# Patient Record
Sex: Female | Born: 1955 | Race: White | Hispanic: No
Health system: Southern US, Community
[De-identification: ages and names within clinical notes are randomized; demographics above are authoritative.]

---

## 2004-09-12 ENCOUNTER — Encounter: Admission: RE | Admit: 2004-09-12 | Discharge: 2004-09-12 | Payer: Self-pay | Admitting: Internal Medicine

## 2004-09-15 ENCOUNTER — Encounter: Admission: RE | Admit: 2004-09-15 | Discharge: 2004-09-15 | Payer: Self-pay | Admitting: Internal Medicine

## 2004-10-01 ENCOUNTER — Other Ambulatory Visit: Admission: RE | Admit: 2004-10-01 | Discharge: 2004-10-01 | Payer: Self-pay | Admitting: Internal Medicine

## 2004-11-21 ENCOUNTER — Ambulatory Visit (HOSPITAL_COMMUNITY): Admission: RE | Admit: 2004-11-21 | Discharge: 2004-11-21 | Payer: Self-pay | Admitting: Obstetrics and Gynecology

## 2004-11-21 ENCOUNTER — Encounter (INDEPENDENT_AMBULATORY_CARE_PROVIDER_SITE_OTHER): Payer: Self-pay | Admitting: Specialist

## 2005-10-08 ENCOUNTER — Other Ambulatory Visit: Admission: RE | Admit: 2005-10-08 | Discharge: 2005-10-08 | Payer: Self-pay | Admitting: Obstetrics and Gynecology

## 2006-01-21 ENCOUNTER — Encounter: Admission: RE | Admit: 2006-01-21 | Discharge: 2006-01-21 | Payer: Self-pay | Admitting: Internal Medicine

## 2006-02-12 ENCOUNTER — Encounter: Admission: RE | Admit: 2006-02-12 | Discharge: 2006-02-12 | Payer: Self-pay | Admitting: Internal Medicine

## 2006-02-17 ENCOUNTER — Encounter: Admission: RE | Admit: 2006-02-17 | Discharge: 2006-02-17 | Payer: Self-pay | Admitting: Internal Medicine

## 2009-02-20 ENCOUNTER — Other Ambulatory Visit: Admission: RE | Admit: 2009-02-20 | Discharge: 2009-02-20 | Payer: Self-pay | Admitting: Obstetrics and Gynecology

## 2010-12-03 ENCOUNTER — Encounter
Admission: RE | Admit: 2010-12-03 | Discharge: 2010-12-03 | Payer: Self-pay | Source: Home / Self Care | Attending: Orthopedic Surgery | Admitting: Orthopedic Surgery

## 2011-02-22 ENCOUNTER — Other Ambulatory Visit: Payer: Self-pay | Admitting: Family Medicine

## 2011-02-22 ENCOUNTER — Other Ambulatory Visit (HOSPITAL_COMMUNITY)
Admission: RE | Admit: 2011-02-22 | Discharge: 2011-02-22 | Disposition: A | Discharge status: Home / Self Care | Payer: 59 | Source: Ambulatory Visit | Attending: Family Medicine | Admitting: Family Medicine

## 2011-02-22 DIAGNOSIS — Z01419 Encounter for gynecological examination (general) (routine) without abnormal findings: Secondary | ICD-10-CM | POA: Insufficient documentation

## 2011-03-22 NOTE — H&P (Signed)
NAME:  Natasha Lewis, Natasha Lewis NO.:  [id number]   MEDICAL RECORD NO.:  [id number]           PATIENT TYPE:   LOCATION:                                 FACILITY:   PHYSICIAN:  Artist Pais, M.D.         DATE OF BIRTH:   DATE OF ADMISSION:  11/21/2004  DATE OF DISCHARGE:                                HISTORY & PHYSICAL   HISTORY OF PRESENT ILLNESS:  The patient is a 55 year old gravida 1, para 0,  SAB, Caucasian female originally referred by Dr. Crista Luria for  endometrial polyp on October 12, 2004.  The patient complained at that time  of irregular bleeding but no post coital bleeding.  She was found to have a  fleshy polypoid structure at the internal os.  Due to her history of  irregular bleeding, she was worked up for this and subsequently was found to  have a very thick endometrium measuring 1.5 cm.  Sonohysterogram showed that  the endometrial cavity had thickening of both the anterior and posterior  walls with mixed echo defects.  This was so thick that the decision was made  to undergo a D&C and hysteroscopy.  There was a focal mixed echo defect  which could be a polyp measuring 3.1 x 1.6 cm without vascular flow.  The  anterior endometrium measured 6.3 mm and the posterior wall measured 15.6  mm.  The decision was made to do a D&C hysteroscopy due to this very thick  endometrium and possible endometrial polyp and also at that time to remove  this possible cervical polyp.  However, this polyp is approximately 1.5 cm  and the patient understands that it may be necessary for me to remove this  by ligating the stalk and removing it using leak cautery.  I have explained  that this could bleed significantly such that she might require an emergent  total abdominal hysterectomy and she expresses understanding of and  acceptance of the risks of surgery including anesthetic complication,  hemorrhage, infection, damage to adjacent structures including bladder,  bowel,  blood vessels, and ureter were discussed with the patient.  She was  made aware of the risks of uterine perforation which could result in  lacerating hemorrhage requiring emergent hysterectomy or uterine perforation  which could result in bowel damage resulting in colostomy or overwhelming  peritonitis.  She expresses understanding of and acceptance of these risks  and desires to proceed.  She does know that it will be challenging to first  remove the polyp and then attempt to dilate her cervix and hysteroscope the  endometrium without causing further bleeding but it also is difficult to  traverse the canal with the polyp in place and this could very well be a  fibroid.  She is thus admitted for [facility] hysteroscopy and removal of this  cervical polyp or cervical fibroid.  All questions were answered.   PAST MEDICAL HISTORY:  Perimenopause, history of upper respiratory  infections.   ALLERGIES:  No known drug allergies.   CURRENT MEDICATIONS:  Jasmine, multivitamin, calcium.  The patient was  started on jasmine by Dr. Marny Lowenstein when she complained of irregular bleeding.   PAST SURGICAL HISTORY:  D&C after a miscarriage in 39.   FAMILY HISTORY:  There is no family history of colon, breast, or ovarian  cancer.  Her father is 14 with prostate cancer and hypertension.  Mother is  34 with hyperthyroidism, hypertension, and pernicious anemia.  Two siblings,  ages 23 and 67, alive and well.   SOCIAL HISTORY:  Occupation:  Building control surveyor.  Tobacco:  None.  Alcohol:  Occasional.   REVIEW OF SYSTEMS:  Noncontributory except as noted above.  Denies headache,  visual changes, chest pain, shortness of breath, abdominal pain, change in  bowel habits, unintentional weight loss, dysuria, urgency, frequency,  vaginal pruritus or discharge, pain or bleeding with intercourse.   PHYSICAL EXAMINATION:  GENERAL APPEARANCE:  A well-developed Caucasian  female.  VITAL SIGNS:  Blood pressure 134/94,  heart rate 88, weight 184.  We will  also recheck her blood pressure prior to surgery as she is on oral  contraceptives.  HEENT:  Normal.  NECK:  Supple without thyromegaly, adenopathy, or nodules.  CHEST:  Clear to auscultation.  BREASTS:  Symmetrical without masses, nodes, nipple retraction, or nipple  discharge.  CARDIAC:  Regular rate and rhythm without extra sounds or murmurs.  ABDOMEN:  Soft, nontender, no hepatosplenomegaly or masses.  EXTREMITIES:  No clubbing, cyanosis, or edema.  NEUROLOGIC:  Oriented x3.  Grossly normal.  PELVIC:  Normal external female genitalia.  No vulvar, vaginal, or cervical  lesions.  Pap smear performed by Dr. Marny Lowenstein recently is within normal  limits.  The patient is noted to have either a cervical polyp or prolapsed  fibroid.  Bimanual examination reveals uterus to be about 6 weeks, mobile,  and nontender without any adnexal mass palpated.   ASSESSMENT AND PLAN:  The patient is a 55 year old gravida 1, para 0,  Caucasian female originally referred for menometrorrhagia and a cervical  polyp admitted for cervical polyp or fibroid removal who was also found to  have a very thickened endometrium and possible endometrial polyp admitted  for [facility] hysteroscopy to rule out malignancy.  All risks have been discussed  with the patient.  She is also made aware of unforeseen risks.  She  expresses understanding of and acceptance of these risks and desires to  proceed with surgery.       ___________________________________________  Artist Pais, M.D.    DC/MEDQ  D:  11/20/2004  T:  11/20/2004  Job:  [id number]   cc:   Preoperative area

## 2011-03-22 NOTE — Op Note (Signed)
NAME:  Natasha Lewis, Natasha Lewis NO.:  [id number]   MEDICAL RECORD NO.:  [id number]          PATIENT TYPE:  AMB   LOCATION:  SDC                           FACILITY:  [facility]   PHYSICIAN:  Artist Pais, M.D.    DATE OF BIRTH:  05-03-1956   DATE OF PROCEDURE:  11/21/2004  DATE OF DISCHARGE:                                 OPERATIVE REPORT   PREOPERATIVE DIAGNOSIS:  Endometrial polyp, menometrorrhagia, large cervical  polyp versus prolapsed fibroid.   POSTOPERATIVE DIAGNOSIS:  Endometrial polyp, menometrorrhagia, large  endocervical polyp.   OPERATION PERFORMED:  Hysteroscopy, dilation and curettage, cervical polyp  removal.   SURGEON:  Artist Pais, M.D.   ANESTHESIA:  Monitored anesthesia care plus 20 mL 1% lidocaine, paracervical  block.   SPECIMENS:  Endocervical polyp, endometrial curettings and polyps.   ESTIMATED BLOOD LOSS:  Minimal.   FLUIDS REPLACED:  Approximately 1100 mL crystalloid.   DESCRIPTION OF PROCEDURE:  The patient was brought to the operating room and  was identified on the operating room table.  After induction of adequate MAC  analgesia, the patient was placed in the dorsal lithotomy position and  prepped and draped in the usual sterile fashion.  The bladder was straight  cathed for clear yellow urine.  Examination under anesthesia revealed the  uterus to be anteverted, about 6 weeks, mobile and nontender, without any  adnexal mass palpated.  The speculum was placed and the anterior lip of the  cervix was infiltrated with 1 mL of 1% lidocaine and grasped with a single  toothed tenaculum.  The remaining 19 mL were placed for a paracervical  block.  Looking at this large fleshy mass emanating from the endocervical  canal, I decided that it would be difficult to maneuver the instrument past  this area, so I decided to go ahead and remove it.  I did grasp the mass  with ring forceps and was able to twist and remove this.  When I grasped it,  I  could easily ascertain that this was an endocervical polyp and not a  fibroid.  There was noted to be no bleeding subsequently.  The cervix was  dilated up to a #25 Pratt dilator.  Dilatation proceeded carefully and  gently to decrease the risk of uterine perforation.  The uterus sounded to  approximately 6.5 cm.  Subsequently, using Sorbitol as a distending medium,  the [facility] hysteroscope was placed and a careful and thorough hysteroscopic  examination was performed. The endometrium was noted to be extremely thick  and the posterior wall appeared to be polypoid. However, I do think this is  just thick endometrium.  This could be possible endometrial polyps as per  the appearance on hysteroscopy and on sonohysterogram.  Because of such  thick lining, I was unable to visualize the tubal ostia.  After a careful  and thorough hysteroscopic examination was performed, the scope was removed  and a careful curettage was performed.  The uterus was curetted in a  systematic clockwise fashion with tissue obtained.  It was curetted until a  good cry  was heard all around and I placed the Randall stone forceps to  remove additional tissue.  This did appear to be endometrial tissue as  opposed to polyps.  Subsequently, the hysteroscope was again placed and the  entire endometrium was noted to have been well curetted and sampled.  Any  other polypoid areas appeared to have been removed.  At that point, the  procedure was then terminated. The patient had been given 20 mL of 1%  lidocaine as a paracervical block, also for postoperative comfort as well as  comfort intraoperatively.  She was subsequently transferred to the recovery  room in stable condition after all instrument, sponge and needle counts were  correct.  It should be noted that there was noted to be no bleeding from the  cervical os, no bleeding from the endocervical polyp removal site and there  was noted to be no bleeding from the tenaculum  site.  The patient received a  postoperative D&C instruction sheet and urged to call with any problems.  She will return to the office in two to three weeks for a follow-up  examination and to discuss treatment plan.      DC/MEDQ  D:  11/21/2004  T:  11/21/2004  Job:  [id number]   cc:   Sharlet Salina, M.D.  [address]  [place]  [place] [address]  Fax: [phone]

## 2013-07-26 ENCOUNTER — Other Ambulatory Visit: Payer: Self-pay | Admitting: Gastroenterology

## 2019-02-22 DIAGNOSIS — E119 Type 2 diabetes mellitus without complications: Secondary | ICD-10-CM | POA: Diagnosis not present

## 2019-02-22 DIAGNOSIS — E785 Hyperlipidemia, unspecified: Secondary | ICD-10-CM | POA: Diagnosis not present

## 2019-02-22 DIAGNOSIS — M5030 Other cervical disc degeneration, unspecified cervical region: Secondary | ICD-10-CM | POA: Diagnosis not present

## 2019-02-22 DIAGNOSIS — I10 Essential (primary) hypertension: Secondary | ICD-10-CM | POA: Diagnosis not present

## 2019-07-26 ENCOUNTER — Other Ambulatory Visit: Payer: Self-pay | Admitting: Family Medicine

## 2019-07-26 DIAGNOSIS — Z1231 Encounter for screening mammogram for malignant neoplasm of breast: Secondary | ICD-10-CM

## 2019-09-09 ENCOUNTER — Ambulatory Visit
Admission: RE | Admit: 2019-09-09 | Discharge: 2019-09-09 | Disposition: A | Discharge status: Home / Self Care | Payer: Self-pay | Source: Ambulatory Visit | Attending: Family Medicine | Admitting: Family Medicine

## 2019-09-09 ENCOUNTER — Other Ambulatory Visit: Payer: Self-pay

## 2019-09-09 DIAGNOSIS — Z1231 Encounter for screening mammogram for malignant neoplasm of breast: Secondary | ICD-10-CM | POA: Diagnosis not present

## 2020-03-07 DIAGNOSIS — E785 Hyperlipidemia, unspecified: Secondary | ICD-10-CM | POA: Diagnosis not present

## 2020-03-07 DIAGNOSIS — Z0000 Encounter for general adult medical examination without abnormal findings: Secondary | ICD-10-CM | POA: Diagnosis not present

## 2020-03-07 DIAGNOSIS — I10 Essential (primary) hypertension: Secondary | ICD-10-CM | POA: Diagnosis not present

## 2020-03-07 DIAGNOSIS — E1169 Type 2 diabetes mellitus with other specified complication: Secondary | ICD-10-CM | POA: Diagnosis not present

## 2020-03-07 DIAGNOSIS — M5030 Other cervical disc degeneration, unspecified cervical region: Secondary | ICD-10-CM | POA: Diagnosis not present

## 2020-03-13 DIAGNOSIS — R52 Pain, unspecified: Secondary | ICD-10-CM | POA: Diagnosis not present

## 2020-03-13 DIAGNOSIS — M79672 Pain in left foot: Secondary | ICD-10-CM | POA: Diagnosis not present

## 2020-08-12 DIAGNOSIS — Z20822 Contact with and (suspected) exposure to covid-19: Secondary | ICD-10-CM | POA: Diagnosis not present

## 2020-09-05 DIAGNOSIS — R69 Illness, unspecified: Secondary | ICD-10-CM | POA: Diagnosis not present

## 2020-09-06 DIAGNOSIS — I10 Essential (primary) hypertension: Secondary | ICD-10-CM | POA: Diagnosis not present

## 2020-09-06 DIAGNOSIS — E1169 Type 2 diabetes mellitus with other specified complication: Secondary | ICD-10-CM | POA: Diagnosis not present

## 2020-09-06 DIAGNOSIS — M5030 Other cervical disc degeneration, unspecified cervical region: Secondary | ICD-10-CM | POA: Diagnosis not present

## 2020-09-06 DIAGNOSIS — Z1389 Encounter for screening for other disorder: Secondary | ICD-10-CM | POA: Diagnosis not present

## 2020-09-14 ENCOUNTER — Ambulatory Visit: Payer: Self-pay | Attending: Internal Medicine

## 2020-09-14 DIAGNOSIS — Z23 Encounter for immunization: Secondary | ICD-10-CM

## 2020-09-14 NOTE — Progress Notes (Signed)
   Covid-19 Vaccination Clinic  Name:  LANDIS CASSARO    MRN: [id number] DOB: [birth year]  09/14/2020  Ms. Kristensen was observed post Covid-19 immunization for 15 minutes without incident. She was provided with Vaccine Information Sheet and instruction to access the V-Safe system.   Ms. Applin was instructed to call 911 with any severe reactions post vaccine: Marland Kitchen Difficulty breathing  . Swelling of face and throat  . A fast heartbeat  . A bad rash all over body  . Dizziness and weakness

## 2020-09-20 DIAGNOSIS — Z20822 Contact with and (suspected) exposure to covid-19: Secondary | ICD-10-CM | POA: Diagnosis not present

## 2020-10-24 ENCOUNTER — Other Ambulatory Visit: Payer: Self-pay | Admitting: Family Medicine

## 2020-10-24 DIAGNOSIS — Z1231 Encounter for screening mammogram for malignant neoplasm of breast: Secondary | ICD-10-CM

## 2020-12-04 ENCOUNTER — Other Ambulatory Visit: Payer: Self-pay

## 2020-12-04 ENCOUNTER — Ambulatory Visit
Admission: RE | Admit: 2020-12-04 | Discharge: 2020-12-04 | Disposition: A | Discharge status: Home / Self Care | Payer: Medicare HMO | Source: Ambulatory Visit | Attending: Family Medicine | Admitting: Family Medicine

## 2020-12-04 DIAGNOSIS — Z1231 Encounter for screening mammogram for malignant neoplasm of breast: Secondary | ICD-10-CM | POA: Diagnosis not present

## 2021-03-16 DIAGNOSIS — I10 Essential (primary) hypertension: Secondary | ICD-10-CM | POA: Diagnosis not present

## 2021-03-16 DIAGNOSIS — Z5320 Procedure and treatment not carried out because of patient's decision for unspecified reasons: Secondary | ICD-10-CM | POA: Diagnosis not present

## 2021-03-16 DIAGNOSIS — Z6835 Body mass index (BMI) 35.0-35.9, adult: Secondary | ICD-10-CM | POA: Diagnosis not present

## 2021-03-16 DIAGNOSIS — Z0000 Encounter for general adult medical examination without abnormal findings: Secondary | ICD-10-CM | POA: Diagnosis not present

## 2021-03-16 DIAGNOSIS — M5030 Other cervical disc degeneration, unspecified cervical region: Secondary | ICD-10-CM | POA: Diagnosis not present

## 2021-03-16 DIAGNOSIS — E1169 Type 2 diabetes mellitus with other specified complication: Secondary | ICD-10-CM | POA: Diagnosis not present

## 2021-03-16 DIAGNOSIS — E785 Hyperlipidemia, unspecified: Secondary | ICD-10-CM | POA: Diagnosis not present

## 2021-03-16 DIAGNOSIS — Z1389 Encounter for screening for other disorder: Secondary | ICD-10-CM | POA: Diagnosis not present

## 2021-03-16 DIAGNOSIS — Z1159 Encounter for screening for other viral diseases: Secondary | ICD-10-CM | POA: Diagnosis not present

## 2021-03-16 DIAGNOSIS — Z23 Encounter for immunization: Secondary | ICD-10-CM | POA: Diagnosis not present

## 2021-03-20 ENCOUNTER — Other Ambulatory Visit: Payer: Self-pay | Admitting: Family Medicine

## 2021-03-20 DIAGNOSIS — E2839 Other primary ovarian failure: Secondary | ICD-10-CM

## 2021-04-12 DIAGNOSIS — H524 Presbyopia: Secondary | ICD-10-CM | POA: Diagnosis not present

## 2021-06-08 ENCOUNTER — Ambulatory Visit
Admission: RE | Admit: 2021-06-08 | Discharge: 2021-06-08 | Disposition: A | Discharge status: Home / Self Care | Payer: Medicare HMO | Source: Ambulatory Visit | Attending: Family Medicine | Admitting: Family Medicine

## 2021-06-08 ENCOUNTER — Other Ambulatory Visit: Payer: Self-pay

## 2021-06-08 DIAGNOSIS — E2839 Other primary ovarian failure: Secondary | ICD-10-CM

## 2021-06-08 DIAGNOSIS — Z780 Asymptomatic menopausal state: Secondary | ICD-10-CM | POA: Diagnosis not present

## 2021-09-18 DIAGNOSIS — E785 Hyperlipidemia, unspecified: Secondary | ICD-10-CM | POA: Diagnosis not present

## 2021-09-18 DIAGNOSIS — Z23 Encounter for immunization: Secondary | ICD-10-CM | POA: Diagnosis not present

## 2021-09-18 DIAGNOSIS — M5030 Other cervical disc degeneration, unspecified cervical region: Secondary | ICD-10-CM | POA: Diagnosis not present

## 2021-09-18 DIAGNOSIS — I10 Essential (primary) hypertension: Secondary | ICD-10-CM | POA: Diagnosis not present

## 2021-09-18 DIAGNOSIS — Z1331 Encounter for screening for depression: Secondary | ICD-10-CM | POA: Diagnosis not present

## 2021-09-18 DIAGNOSIS — Z1389 Encounter for screening for other disorder: Secondary | ICD-10-CM | POA: Diagnosis not present

## 2021-09-18 DIAGNOSIS — E1169 Type 2 diabetes mellitus with other specified complication: Secondary | ICD-10-CM | POA: Diagnosis not present

## 2021-10-25 ENCOUNTER — Other Ambulatory Visit: Payer: Self-pay | Admitting: Family Medicine

## 2021-10-25 DIAGNOSIS — Z1231 Encounter for screening mammogram for malignant neoplasm of breast: Secondary | ICD-10-CM

## 2021-12-05 ENCOUNTER — Ambulatory Visit
Admission: RE | Admit: 2021-12-05 | Discharge: 2021-12-05 | Disposition: A | Discharge status: Home / Self Care | Payer: Medicare HMO | Source: Ambulatory Visit | Attending: Family Medicine | Admitting: Family Medicine

## 2021-12-05 DIAGNOSIS — Z1231 Encounter for screening mammogram for malignant neoplasm of breast: Secondary | ICD-10-CM

## 2022-04-08 DIAGNOSIS — Z1389 Encounter for screening for other disorder: Secondary | ICD-10-CM | POA: Diagnosis not present

## 2022-04-08 DIAGNOSIS — M5030 Other cervical disc degeneration, unspecified cervical region: Secondary | ICD-10-CM | POA: Diagnosis not present

## 2022-04-08 DIAGNOSIS — Z5320 Procedure and treatment not carried out because of patient's decision for unspecified reasons: Secondary | ICD-10-CM | POA: Diagnosis not present

## 2022-04-08 DIAGNOSIS — I10 Essential (primary) hypertension: Secondary | ICD-10-CM | POA: Diagnosis not present

## 2022-04-08 DIAGNOSIS — E1169 Type 2 diabetes mellitus with other specified complication: Secondary | ICD-10-CM | POA: Diagnosis not present

## 2022-04-08 DIAGNOSIS — E785 Hyperlipidemia, unspecified: Secondary | ICD-10-CM | POA: Diagnosis not present

## 2022-04-08 DIAGNOSIS — Z0000 Encounter for general adult medical examination without abnormal findings: Secondary | ICD-10-CM | POA: Diagnosis not present

## 2022-05-25 DIAGNOSIS — J069 Acute upper respiratory infection, unspecified: Secondary | ICD-10-CM | POA: Diagnosis not present

## 2022-05-25 DIAGNOSIS — H6992 Unspecified Eustachian tube disorder, left ear: Secondary | ICD-10-CM | POA: Diagnosis not present

## 2022-10-08 DIAGNOSIS — Z5320 Procedure and treatment not carried out because of patient's decision for unspecified reasons: Secondary | ICD-10-CM | POA: Diagnosis not present

## 2022-10-08 DIAGNOSIS — E6609 Other obesity due to excess calories: Secondary | ICD-10-CM | POA: Diagnosis not present

## 2022-10-08 DIAGNOSIS — M25511 Pain in right shoulder: Secondary | ICD-10-CM | POA: Diagnosis not present

## 2022-10-08 DIAGNOSIS — E1169 Type 2 diabetes mellitus with other specified complication: Secondary | ICD-10-CM | POA: Diagnosis not present

## 2022-10-08 DIAGNOSIS — Z1331 Encounter for screening for depression: Secondary | ICD-10-CM | POA: Diagnosis not present

## 2022-10-08 DIAGNOSIS — M5030 Other cervical disc degeneration, unspecified cervical region: Secondary | ICD-10-CM | POA: Diagnosis not present

## 2022-10-08 DIAGNOSIS — I10 Essential (primary) hypertension: Secondary | ICD-10-CM | POA: Diagnosis not present

## 2022-10-08 DIAGNOSIS — E785 Hyperlipidemia, unspecified: Secondary | ICD-10-CM | POA: Diagnosis not present

## 2022-10-11 ENCOUNTER — Other Ambulatory Visit: Payer: Self-pay | Admitting: Family Medicine

## 2022-10-11 ENCOUNTER — Ambulatory Visit
Admission: RE | Admit: 2022-10-11 | Discharge: 2022-10-11 | Disposition: A | Discharge status: Home / Self Care | Payer: Medicare HMO | Source: Ambulatory Visit | Attending: Family Medicine | Admitting: Family Medicine

## 2022-10-11 DIAGNOSIS — M25511 Pain in right shoulder: Secondary | ICD-10-CM | POA: Diagnosis not present

## 2022-11-05 ENCOUNTER — Other Ambulatory Visit: Payer: Self-pay | Admitting: Family Medicine

## 2022-11-05 DIAGNOSIS — Z1231 Encounter for screening mammogram for malignant neoplasm of breast: Secondary | ICD-10-CM

## 2022-11-13 DIAGNOSIS — M25511 Pain in right shoulder: Secondary | ICD-10-CM | POA: Diagnosis not present

## 2022-11-20 DIAGNOSIS — M25511 Pain in right shoulder: Secondary | ICD-10-CM | POA: Diagnosis not present

## 2022-11-29 DIAGNOSIS — M25511 Pain in right shoulder: Secondary | ICD-10-CM | POA: Diagnosis not present

## 2022-12-06 DIAGNOSIS — M25511 Pain in right shoulder: Secondary | ICD-10-CM | POA: Diagnosis not present

## 2022-12-13 DIAGNOSIS — M25511 Pain in right shoulder: Secondary | ICD-10-CM | POA: Diagnosis not present

## 2022-12-17 DIAGNOSIS — M25511 Pain in right shoulder: Secondary | ICD-10-CM | POA: Diagnosis not present

## 2022-12-20 DIAGNOSIS — M25511 Pain in right shoulder: Secondary | ICD-10-CM | POA: Diagnosis not present

## 2022-12-25 ENCOUNTER — Ambulatory Visit
Admission: RE | Admit: 2022-12-25 | Discharge: 2022-12-25 | Disposition: A | Discharge status: Home / Self Care | Payer: Medicare HMO | Source: Ambulatory Visit | Attending: Family Medicine | Admitting: Family Medicine

## 2022-12-25 DIAGNOSIS — Z1231 Encounter for screening mammogram for malignant neoplasm of breast: Secondary | ICD-10-CM | POA: Diagnosis not present

## 2022-12-27 DIAGNOSIS — M25511 Pain in right shoulder: Secondary | ICD-10-CM | POA: Diagnosis not present

## 2023-01-03 DIAGNOSIS — M25511 Pain in right shoulder: Secondary | ICD-10-CM | POA: Diagnosis not present

## 2023-01-10 DIAGNOSIS — M25511 Pain in right shoulder: Secondary | ICD-10-CM | POA: Diagnosis not present

## 2023-01-17 DIAGNOSIS — M25511 Pain in right shoulder: Secondary | ICD-10-CM | POA: Diagnosis not present

## 2023-03-07 DIAGNOSIS — H524 Presbyopia: Secondary | ICD-10-CM | POA: Diagnosis not present

## 2023-04-29 DIAGNOSIS — I10 Essential (primary) hypertension: Secondary | ICD-10-CM | POA: Diagnosis not present

## 2023-04-29 DIAGNOSIS — Z23 Encounter for immunization: Secondary | ICD-10-CM | POA: Diagnosis not present

## 2023-04-29 DIAGNOSIS — E1169 Type 2 diabetes mellitus with other specified complication: Secondary | ICD-10-CM | POA: Diagnosis not present

## 2023-04-29 DIAGNOSIS — E785 Hyperlipidemia, unspecified: Secondary | ICD-10-CM | POA: Diagnosis not present

## 2023-04-29 DIAGNOSIS — Z0000 Encounter for general adult medical examination without abnormal findings: Secondary | ICD-10-CM | POA: Diagnosis not present

## 2023-04-29 DIAGNOSIS — M5030 Other cervical disc degeneration, unspecified cervical region: Secondary | ICD-10-CM | POA: Diagnosis not present

## 2023-04-29 DIAGNOSIS — Z6835 Body mass index (BMI) 35.0-35.9, adult: Secondary | ICD-10-CM | POA: Diagnosis not present

## 2023-04-29 DIAGNOSIS — E119 Type 2 diabetes mellitus without complications: Secondary | ICD-10-CM | POA: Diagnosis not present

## 2023-04-29 DIAGNOSIS — M2550 Pain in unspecified joint: Secondary | ICD-10-CM | POA: Diagnosis not present

## 2023-04-29 DIAGNOSIS — Z1331 Encounter for screening for depression: Secondary | ICD-10-CM | POA: Diagnosis not present

## 2023-05-21 DIAGNOSIS — M2540 Effusion, unspecified joint: Secondary | ICD-10-CM | POA: Diagnosis not present

## 2023-05-21 DIAGNOSIS — M79642 Pain in left hand: Secondary | ICD-10-CM | POA: Diagnosis not present

## 2023-05-21 DIAGNOSIS — M79641 Pain in right hand: Secondary | ICD-10-CM | POA: Diagnosis not present

## 2023-05-21 DIAGNOSIS — Z6834 Body mass index (BMI) 34.0-34.9, adult: Secondary | ICD-10-CM | POA: Diagnosis not present

## 2023-05-21 DIAGNOSIS — M79672 Pain in left foot: Secondary | ICD-10-CM | POA: Diagnosis not present

## 2023-05-21 DIAGNOSIS — M79671 Pain in right foot: Secondary | ICD-10-CM | POA: Diagnosis not present

## 2023-05-21 DIAGNOSIS — Z111 Encounter for screening for respiratory tuberculosis: Secondary | ICD-10-CM | POA: Diagnosis not present

## 2023-05-21 DIAGNOSIS — M2550 Pain in unspecified joint: Secondary | ICD-10-CM | POA: Diagnosis not present

## 2023-05-21 DIAGNOSIS — M2560 Stiffness of unspecified joint, not elsewhere classified: Secondary | ICD-10-CM | POA: Diagnosis not present

## 2023-05-21 DIAGNOSIS — R5383 Other fatigue: Secondary | ICD-10-CM | POA: Diagnosis not present

## 2023-05-25 IMAGING — MG MM DIGITAL SCREENING BILAT W/ TOMO AND CAD
6 of 12 series · 6 of 36 positions shown · non-contrast
Comparison: Previous exam(s).

CLINICAL DATA: Screening.

EXAM:
DIGITAL SCREENING BILATERAL MAMMOGRAM WITH TOMOSYNTHESIS AND CAD
TECHNIQUE: Bilateral screening digital craniocaudal and mediolateral oblique
mammograms were obtained. Bilateral screening digital breast
tomosynthesis was performed. The images were evaluated with
computer-aided detection.

[R CC synth-2D (1 of 2)]
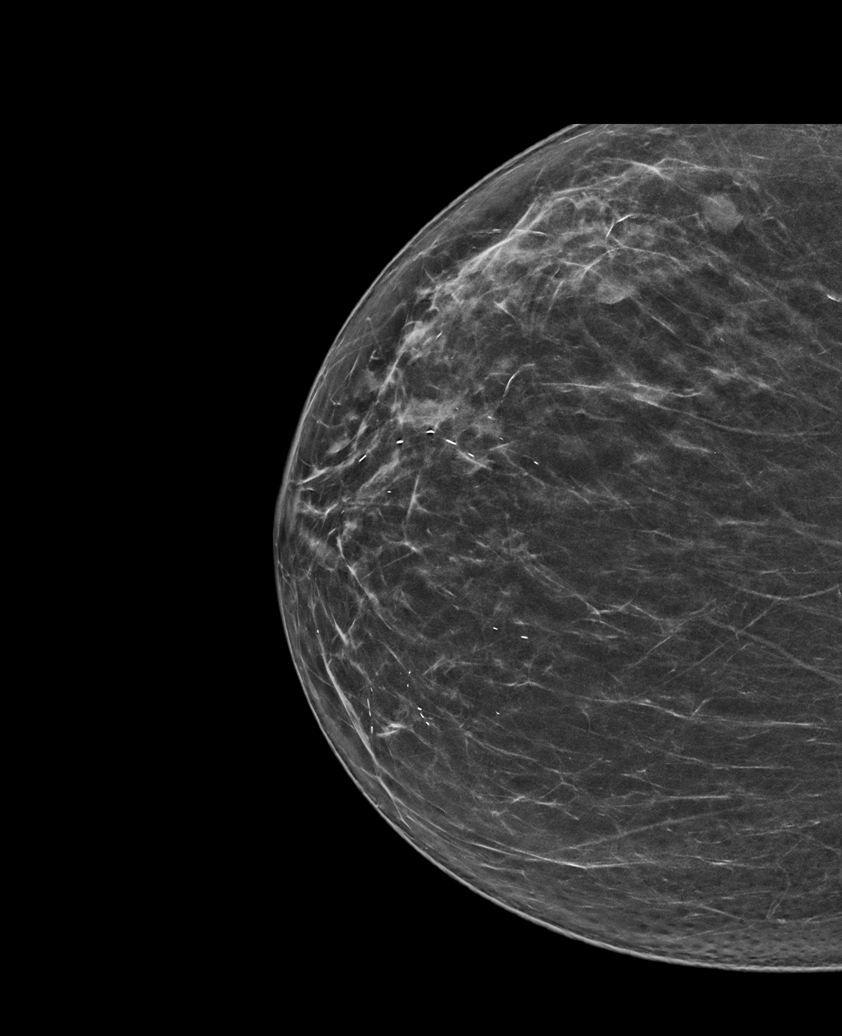

[L CC synth-2D (1 of 2)]
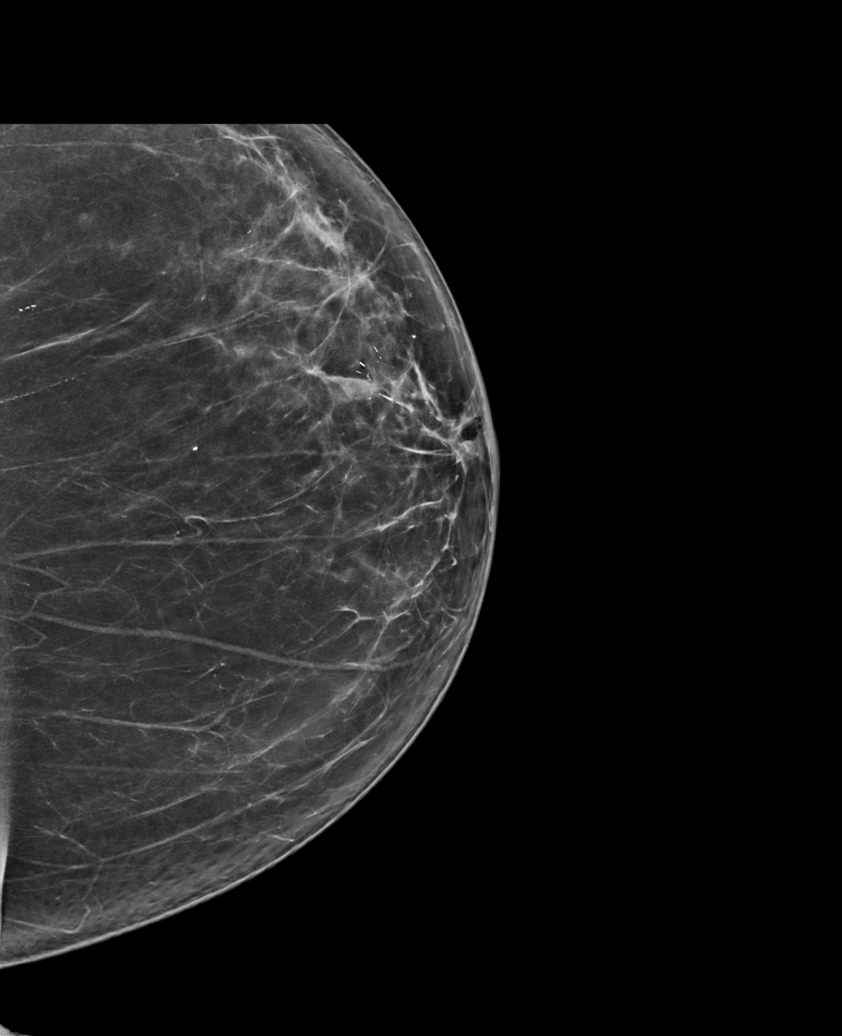

[L MLO synth-2D]
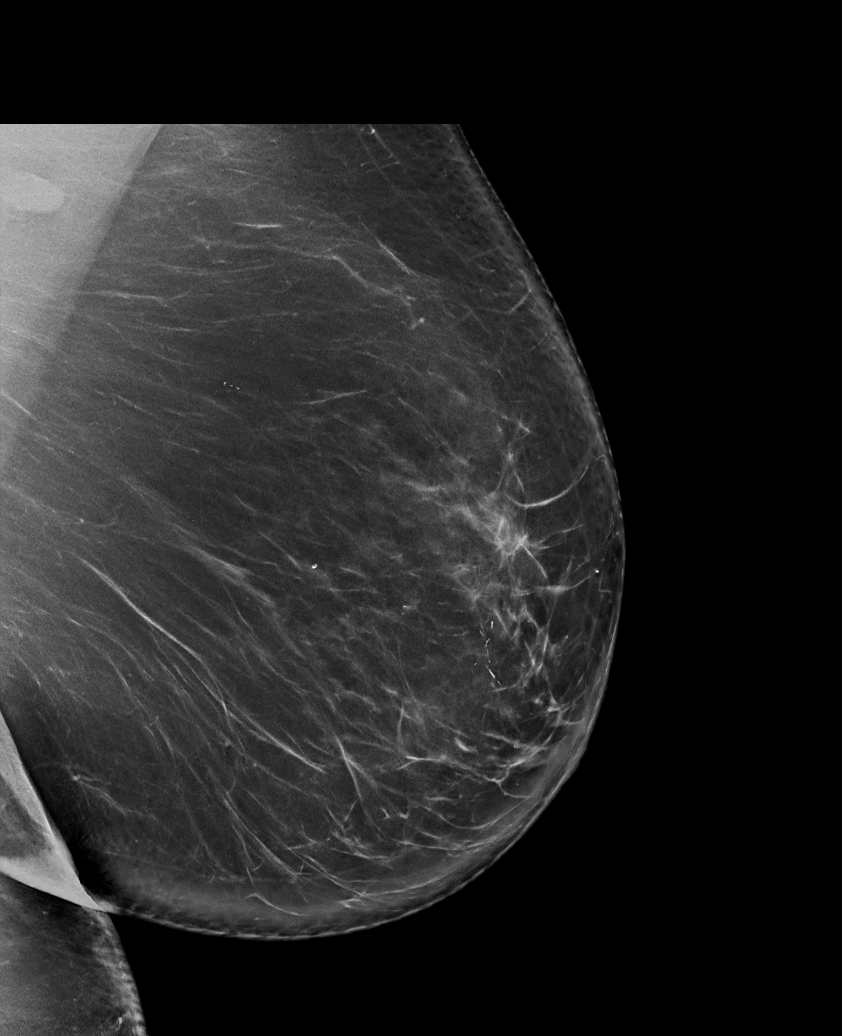

[R CC synth-2D (2 of 2)]
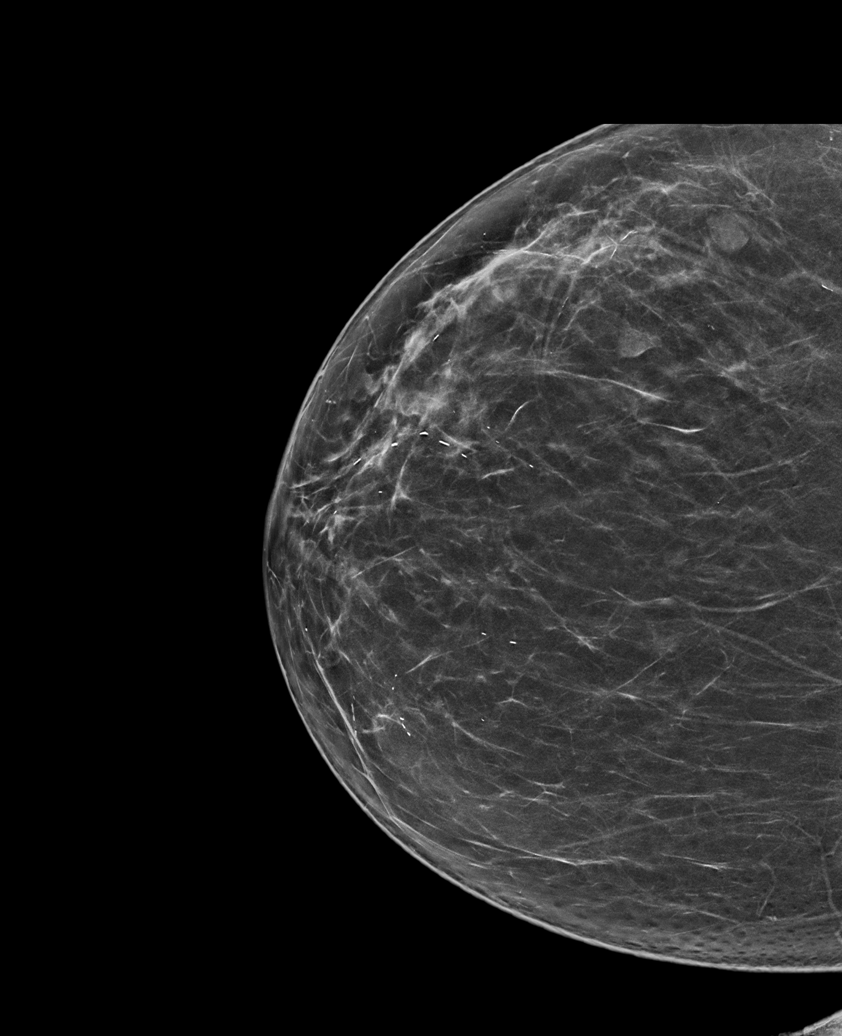

[R MLO synth-2D]
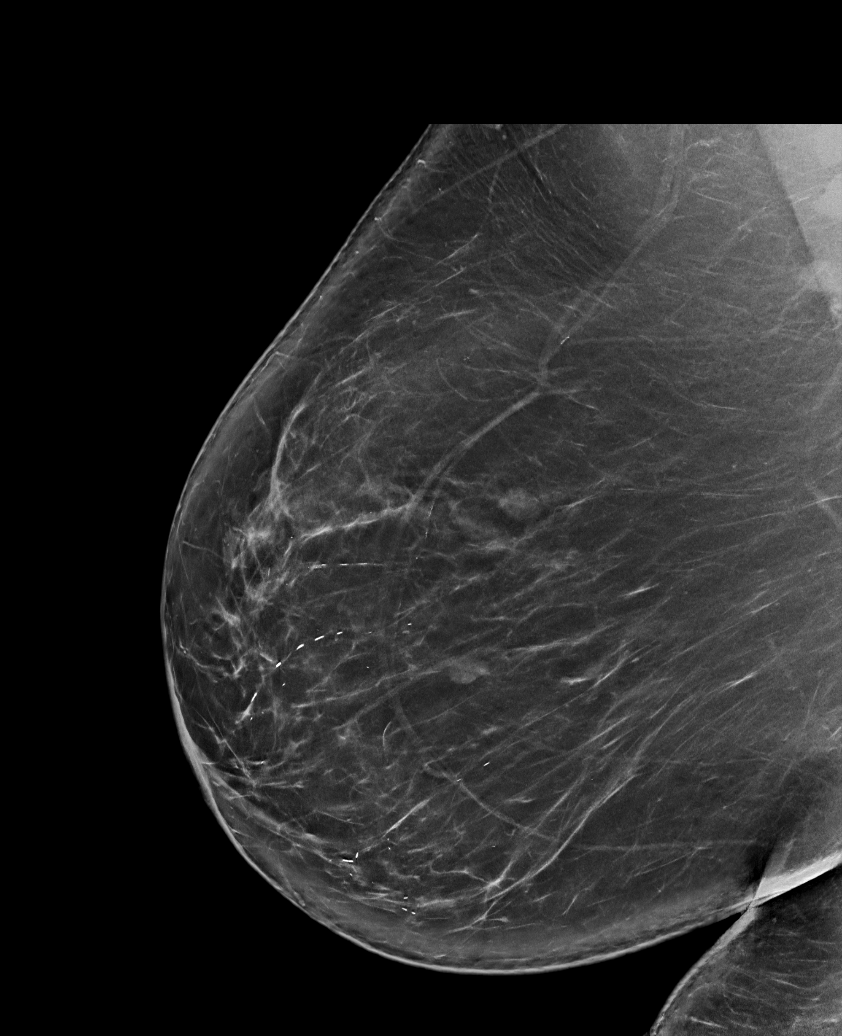

[L CC synth-2D (2 of 2)]
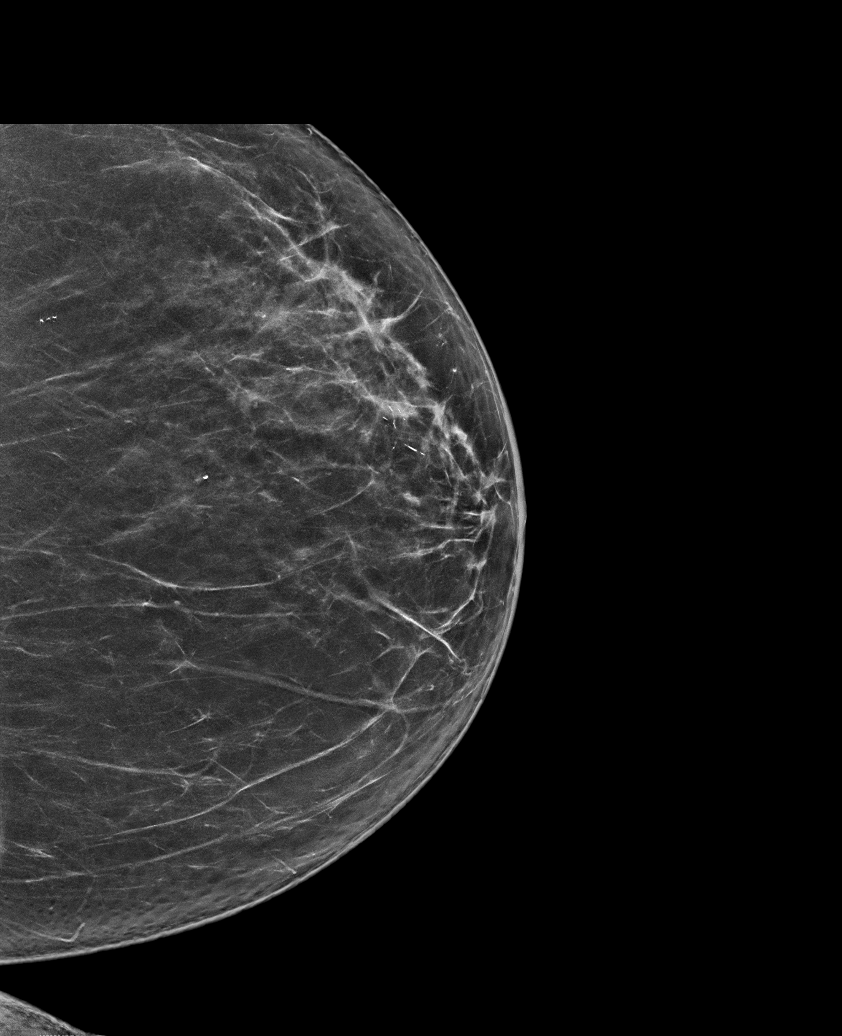

[6 of 36 positions shown; findings below may reference images not displayed]

ACR Breast Density Category b: There are scattered areas of
fibroglandular density.
FINDINGS: There are no findings suspicious for malignancy.
IMPRESSION: No mammographic evidence of malignancy. A result letter of this
screening mammogram will be mailed directly to the patient.

RECOMMENDATION:
Screening mammogram in one year. (Code:51-O-LD2)

BI-RADS CATEGORY  1: Negative.

## 2023-06-03 DIAGNOSIS — M2560 Stiffness of unspecified joint, not elsewhere classified: Secondary | ICD-10-CM | POA: Diagnosis not present

## 2023-06-03 DIAGNOSIS — Z6834 Body mass index (BMI) 34.0-34.9, adult: Secondary | ICD-10-CM | POA: Diagnosis not present

## 2023-06-03 DIAGNOSIS — R5383 Other fatigue: Secondary | ICD-10-CM | POA: Diagnosis not present

## 2023-06-03 DIAGNOSIS — E669 Obesity, unspecified: Secondary | ICD-10-CM | POA: Diagnosis not present

## 2023-06-03 DIAGNOSIS — M109 Gout, unspecified: Secondary | ICD-10-CM | POA: Diagnosis not present

## 2023-06-03 DIAGNOSIS — M2540 Effusion, unspecified joint: Secondary | ICD-10-CM | POA: Diagnosis not present

## 2023-07-01 DIAGNOSIS — M109 Gout, unspecified: Secondary | ICD-10-CM | POA: Diagnosis not present

## 2023-08-04 DIAGNOSIS — M109 Gout, unspecified: Secondary | ICD-10-CM | POA: Diagnosis not present

## 2023-08-07 DIAGNOSIS — E785 Hyperlipidemia, unspecified: Secondary | ICD-10-CM | POA: Diagnosis not present

## 2023-09-02 DIAGNOSIS — M2560 Stiffness of unspecified joint, not elsewhere classified: Secondary | ICD-10-CM | POA: Diagnosis not present

## 2023-09-02 DIAGNOSIS — M109 Gout, unspecified: Secondary | ICD-10-CM | POA: Diagnosis not present

## 2023-09-02 DIAGNOSIS — Z6834 Body mass index (BMI) 34.0-34.9, adult: Secondary | ICD-10-CM | POA: Diagnosis not present

## 2023-09-02 DIAGNOSIS — R5383 Other fatigue: Secondary | ICD-10-CM | POA: Diagnosis not present

## 2023-09-02 DIAGNOSIS — M2540 Effusion, unspecified joint: Secondary | ICD-10-CM | POA: Diagnosis not present

## 2023-09-02 DIAGNOSIS — E669 Obesity, unspecified: Secondary | ICD-10-CM | POA: Diagnosis not present

## 2023-09-09 DIAGNOSIS — K5730 Diverticulosis of large intestine without perforation or abscess without bleeding: Secondary | ICD-10-CM | POA: Diagnosis not present

## 2023-09-09 DIAGNOSIS — Z1211 Encounter for screening for malignant neoplasm of colon: Secondary | ICD-10-CM | POA: Diagnosis not present

## 2023-09-30 DIAGNOSIS — M109 Gout, unspecified: Secondary | ICD-10-CM | POA: Diagnosis not present

## 2023-10-01 ENCOUNTER — Other Ambulatory Visit: Payer: Self-pay | Admitting: Rheumatology

## 2023-10-01 DIAGNOSIS — R7989 Other specified abnormal findings of blood chemistry: Secondary | ICD-10-CM

## 2023-10-07 ENCOUNTER — Ambulatory Visit
Admission: RE | Admit: 2023-10-07 | Discharge: 2023-10-07 | Disposition: A | Discharge status: Home / Self Care | Payer: Medicare HMO | Source: Ambulatory Visit | Attending: Rheumatology | Admitting: Rheumatology

## 2023-10-07 DIAGNOSIS — R7989 Other specified abnormal findings of blood chemistry: Secondary | ICD-10-CM

## 2023-10-07 DIAGNOSIS — R945 Abnormal results of liver function studies: Secondary | ICD-10-CM | POA: Diagnosis not present

## 2023-10-22 DIAGNOSIS — M109 Gout, unspecified: Secondary | ICD-10-CM | POA: Diagnosis not present

## 2023-10-22 DIAGNOSIS — E669 Obesity, unspecified: Secondary | ICD-10-CM | POA: Diagnosis not present

## 2023-10-22 DIAGNOSIS — M2560 Stiffness of unspecified joint, not elsewhere classified: Secondary | ICD-10-CM | POA: Diagnosis not present

## 2023-10-22 DIAGNOSIS — Z6834 Body mass index (BMI) 34.0-34.9, adult: Secondary | ICD-10-CM | POA: Diagnosis not present

## 2023-10-22 DIAGNOSIS — R5383 Other fatigue: Secondary | ICD-10-CM | POA: Diagnosis not present

## 2023-10-22 DIAGNOSIS — M2540 Effusion, unspecified joint: Secondary | ICD-10-CM | POA: Diagnosis not present

## 2023-10-24 DIAGNOSIS — E785 Hyperlipidemia, unspecified: Secondary | ICD-10-CM | POA: Diagnosis not present

## 2023-10-24 DIAGNOSIS — M5030 Other cervical disc degeneration, unspecified cervical region: Secondary | ICD-10-CM | POA: Diagnosis not present

## 2023-10-24 DIAGNOSIS — I10 Essential (primary) hypertension: Secondary | ICD-10-CM | POA: Diagnosis not present

## 2023-10-24 DIAGNOSIS — E119 Type 2 diabetes mellitus without complications: Secondary | ICD-10-CM | POA: Diagnosis not present

## 2023-10-24 DIAGNOSIS — M109 Gout, unspecified: Secondary | ICD-10-CM | POA: Diagnosis not present

## 2023-10-24 DIAGNOSIS — E6609 Other obesity due to excess calories: Secondary | ICD-10-CM | POA: Diagnosis not present

## 2023-11-21 ENCOUNTER — Other Ambulatory Visit: Payer: Self-pay | Admitting: Family Medicine

## 2023-11-21 DIAGNOSIS — Z1231 Encounter for screening mammogram for malignant neoplasm of breast: Secondary | ICD-10-CM

## 2023-12-03 DIAGNOSIS — M109 Gout, unspecified: Secondary | ICD-10-CM | POA: Diagnosis not present

## 2023-12-29 ENCOUNTER — Ambulatory Visit
Admission: RE | Admit: 2023-12-29 | Discharge: 2023-12-29 | Disposition: A | Discharge status: Home / Self Care | Payer: Medicare HMO | Source: Ambulatory Visit | Attending: Family Medicine | Admitting: Family Medicine

## 2023-12-29 DIAGNOSIS — Z1231 Encounter for screening mammogram for malignant neoplasm of breast: Secondary | ICD-10-CM | POA: Diagnosis not present

## 2024-01-06 DIAGNOSIS — M2560 Stiffness of unspecified joint, not elsewhere classified: Secondary | ICD-10-CM | POA: Diagnosis not present

## 2024-01-06 DIAGNOSIS — Z6833 Body mass index (BMI) 33.0-33.9, adult: Secondary | ICD-10-CM | POA: Diagnosis not present

## 2024-01-06 DIAGNOSIS — M2540 Effusion, unspecified joint: Secondary | ICD-10-CM | POA: Diagnosis not present

## 2024-01-06 DIAGNOSIS — E669 Obesity, unspecified: Secondary | ICD-10-CM | POA: Diagnosis not present

## 2024-01-06 DIAGNOSIS — R5383 Other fatigue: Secondary | ICD-10-CM | POA: Diagnosis not present

## 2024-01-06 DIAGNOSIS — M109 Gout, unspecified: Secondary | ICD-10-CM | POA: Diagnosis not present

## 2024-03-12 DIAGNOSIS — R945 Abnormal results of liver function studies: Secondary | ICD-10-CM | POA: Diagnosis not present

## 2024-03-12 DIAGNOSIS — E790 Hyperuricemia without signs of inflammatory arthritis and tophaceous disease: Secondary | ICD-10-CM | POA: Diagnosis not present

## 2024-04-06 DIAGNOSIS — M2560 Stiffness of unspecified joint, not elsewhere classified: Secondary | ICD-10-CM | POA: Diagnosis not present

## 2024-04-06 DIAGNOSIS — R5383 Other fatigue: Secondary | ICD-10-CM | POA: Diagnosis not present

## 2024-04-06 DIAGNOSIS — Z6832 Body mass index (BMI) 32.0-32.9, adult: Secondary | ICD-10-CM | POA: Diagnosis not present

## 2024-04-06 DIAGNOSIS — M2540 Effusion, unspecified joint: Secondary | ICD-10-CM | POA: Diagnosis not present

## 2024-04-06 DIAGNOSIS — E669 Obesity, unspecified: Secondary | ICD-10-CM | POA: Diagnosis not present

## 2024-04-06 DIAGNOSIS — M109 Gout, unspecified: Secondary | ICD-10-CM | POA: Diagnosis not present

## 2024-04-22 DIAGNOSIS — H524 Presbyopia: Secondary | ICD-10-CM | POA: Diagnosis not present

## 2024-04-22 DIAGNOSIS — H2513 Age-related nuclear cataract, bilateral: Secondary | ICD-10-CM | POA: Diagnosis not present

## 2024-05-04 DIAGNOSIS — M109 Gout, unspecified: Secondary | ICD-10-CM | POA: Diagnosis not present

## 2024-07-13 DIAGNOSIS — M2560 Stiffness of unspecified joint, not elsewhere classified: Secondary | ICD-10-CM | POA: Diagnosis not present

## 2024-07-13 DIAGNOSIS — Z6832 Body mass index (BMI) 32.0-32.9, adult: Secondary | ICD-10-CM | POA: Diagnosis not present

## 2024-07-13 DIAGNOSIS — E669 Obesity, unspecified: Secondary | ICD-10-CM | POA: Diagnosis not present

## 2024-07-13 DIAGNOSIS — M109 Gout, unspecified: Secondary | ICD-10-CM | POA: Diagnosis not present

## 2024-07-13 DIAGNOSIS — R5383 Other fatigue: Secondary | ICD-10-CM | POA: Diagnosis not present

## 2024-07-13 DIAGNOSIS — M2540 Effusion, unspecified joint: Secondary | ICD-10-CM | POA: Diagnosis not present

## 2024-09-08 DIAGNOSIS — M109 Gout, unspecified: Secondary | ICD-10-CM | POA: Diagnosis not present

## 2024-10-12 DIAGNOSIS — M2560 Stiffness of unspecified joint, not elsewhere classified: Secondary | ICD-10-CM | POA: Diagnosis not present

## 2024-10-12 DIAGNOSIS — E669 Obesity, unspecified: Secondary | ICD-10-CM | POA: Diagnosis not present

## 2024-10-12 DIAGNOSIS — R945 Abnormal results of liver function studies: Secondary | ICD-10-CM | POA: Diagnosis not present

## 2024-10-12 DIAGNOSIS — M2540 Effusion, unspecified joint: Secondary | ICD-10-CM | POA: Diagnosis not present

## 2024-10-12 DIAGNOSIS — R5383 Other fatigue: Secondary | ICD-10-CM | POA: Diagnosis not present

## 2024-10-12 DIAGNOSIS — M109 Gout, unspecified: Secondary | ICD-10-CM | POA: Diagnosis not present

## 2024-10-12 DIAGNOSIS — Z6833 Body mass index (BMI) 33.0-33.9, adult: Secondary | ICD-10-CM | POA: Diagnosis not present

## 2024-11-23 ENCOUNTER — Other Ambulatory Visit: Payer: Self-pay | Admitting: Family Medicine

## 2024-11-23 DIAGNOSIS — Z1231 Encounter for screening mammogram for malignant neoplasm of breast: Secondary | ICD-10-CM

## 2024-12-29 ENCOUNTER — Ambulatory Visit
